# Patient Record
Sex: Female | Born: 1965
Health system: Southern US, Community
[De-identification: ages and names within clinical notes are randomized; demographics above are authoritative.]

## PROBLEM LIST (undated history)

## (undated) DIAGNOSIS — E119 Type 2 diabetes mellitus without complications: Secondary | ICD-10-CM

## (undated) DIAGNOSIS — I10 Essential (primary) hypertension: Secondary | ICD-10-CM

## (undated) DIAGNOSIS — F329 Major depressive disorder, single episode, unspecified: Secondary | ICD-10-CM

## (undated) DIAGNOSIS — F32A Depression, unspecified: Secondary | ICD-10-CM

## (undated) HISTORY — PX: APPENDECTOMY: SHX54

---

## 1997-10-28 ENCOUNTER — Inpatient Hospital Stay (HOSPITAL_COMMUNITY): Admission: AD | Admit: 1997-10-28 | Discharge: 1997-10-28 | Payer: Self-pay | Admitting: Obstetrics and Gynecology

## 1997-12-08 ENCOUNTER — Other Ambulatory Visit: Admission: RE | Admit: 1997-12-08 | Discharge: 1997-12-08 | Payer: Self-pay | Admitting: Obstetrics & Gynecology

## 1997-12-16 ENCOUNTER — Ambulatory Visit (HOSPITAL_COMMUNITY): Admission: RE | Admit: 1997-12-16 | Discharge: 1997-12-16 | Payer: Self-pay | Admitting: Obstetrics & Gynecology

## 1998-01-25 ENCOUNTER — Inpatient Hospital Stay (HOSPITAL_COMMUNITY): Admission: AD | Admit: 1998-01-25 | Discharge: 1998-01-25 | Payer: Self-pay | Admitting: Obstetrics and Gynecology

## 1998-02-27 ENCOUNTER — Inpatient Hospital Stay (HOSPITAL_COMMUNITY): Admission: AD | Admit: 1998-02-27 | Discharge: 1998-03-01 | Payer: Self-pay | Admitting: Obstetrics & Gynecology

## 1998-05-18 ENCOUNTER — Emergency Department (HOSPITAL_COMMUNITY): Admission: EM | Admit: 1998-05-18 | Discharge: 1998-05-18 | Payer: Self-pay | Admitting: Internal Medicine

## 1998-05-18 ENCOUNTER — Encounter: Payer: Self-pay | Admitting: Internal Medicine

## 2001-01-10 ENCOUNTER — Other Ambulatory Visit: Admission: RE | Admit: 2001-01-10 | Discharge: 2001-01-10 | Payer: Self-pay | Admitting: Obstetrics and Gynecology

## 2001-03-15 ENCOUNTER — Encounter: Payer: Self-pay | Admitting: Obstetrics and Gynecology

## 2001-03-15 ENCOUNTER — Ambulatory Visit (HOSPITAL_COMMUNITY): Admission: RE | Admit: 2001-03-15 | Discharge: 2001-03-15 | Payer: Self-pay | Admitting: Obstetrics and Gynecology

## 2001-03-21 ENCOUNTER — Inpatient Hospital Stay (HOSPITAL_COMMUNITY): Admission: AD | Admit: 2001-03-21 | Discharge: 2001-03-21 | Payer: Self-pay | Admitting: Obstetrics and Gynecology

## 2001-04-04 ENCOUNTER — Encounter: Admission: RE | Admit: 2001-04-04 | Discharge: 2001-07-03 | Payer: Self-pay | Admitting: Obstetrics and Gynecology

## 2001-05-01 ENCOUNTER — Inpatient Hospital Stay (HOSPITAL_COMMUNITY): Admission: AD | Admit: 2001-05-01 | Discharge: 2001-05-01 | Payer: Self-pay | Admitting: Obstetrics and Gynecology

## 2001-07-09 ENCOUNTER — Observation Stay (HOSPITAL_COMMUNITY): Admission: AD | Admit: 2001-07-09 | Discharge: 2001-07-10 | Payer: Self-pay | Admitting: Obstetrics and Gynecology

## 2001-07-12 ENCOUNTER — Inpatient Hospital Stay (HOSPITAL_COMMUNITY): Admission: AD | Admit: 2001-07-12 | Discharge: 2001-07-12 | Payer: Self-pay | Admitting: Obstetrics and Gynecology

## 2001-08-04 ENCOUNTER — Inpatient Hospital Stay (HOSPITAL_COMMUNITY): Admission: AD | Admit: 2001-08-04 | Discharge: 2001-08-06 | Payer: Self-pay | Admitting: Obstetrics and Gynecology

## 2002-08-01 ENCOUNTER — Other Ambulatory Visit: Admission: RE | Admit: 2002-08-01 | Discharge: 2002-08-01 | Payer: Self-pay | Admitting: Obstetrics and Gynecology

## 2003-08-13 ENCOUNTER — Other Ambulatory Visit: Admission: RE | Admit: 2003-08-13 | Discharge: 2003-08-13 | Payer: Self-pay | Admitting: Obstetrics and Gynecology

## 2005-03-04 ENCOUNTER — Other Ambulatory Visit: Admission: RE | Admit: 2005-03-04 | Discharge: 2005-03-04 | Payer: Self-pay | Admitting: Obstetrics and Gynecology

## 2005-04-26 ENCOUNTER — Ambulatory Visit (HOSPITAL_COMMUNITY): Admission: RE | Admit: 2005-04-26 | Discharge: 2005-04-26 | Payer: Self-pay | Admitting: Obstetrics and Gynecology

## 2005-06-22 ENCOUNTER — Encounter: Admission: RE | Admit: 2005-06-22 | Discharge: 2005-06-22 | Payer: Self-pay | Admitting: Obstetrics and Gynecology

## 2005-09-20 ENCOUNTER — Inpatient Hospital Stay (HOSPITAL_COMMUNITY): Admission: AD | Admit: 2005-09-20 | Discharge: 2005-09-23 | Payer: Self-pay | Admitting: Obstetrics and Gynecology

## 2007-08-04 IMAGING — US US OB DETAIL+14 WK
1 series · 13 of 28 positions shown · non-contrast
Comparison: none

CLINICAL DATA: 18 week 0 day assigned gestational age by prior office ultrasound.  Advanced maternal age.  Evaluate detailed anatomy.

[Series 1: us ob detail+14 wk · 0.27mm/px · 13 of 78 slices shown]
[im 3/78]
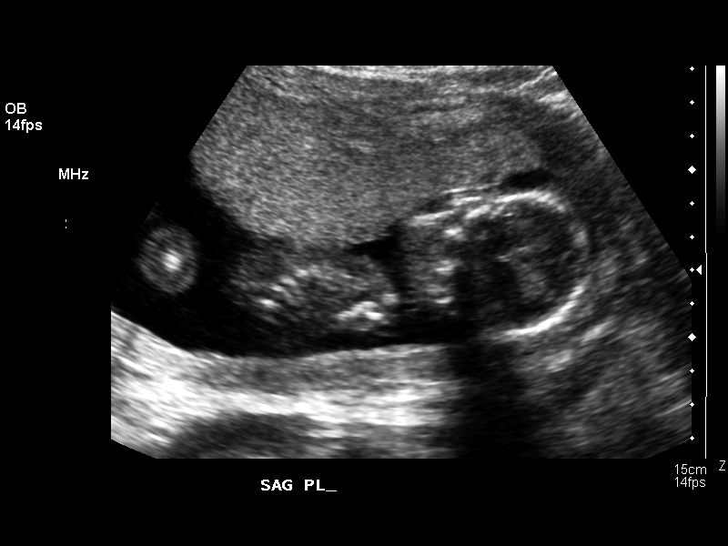
[im 9/78]
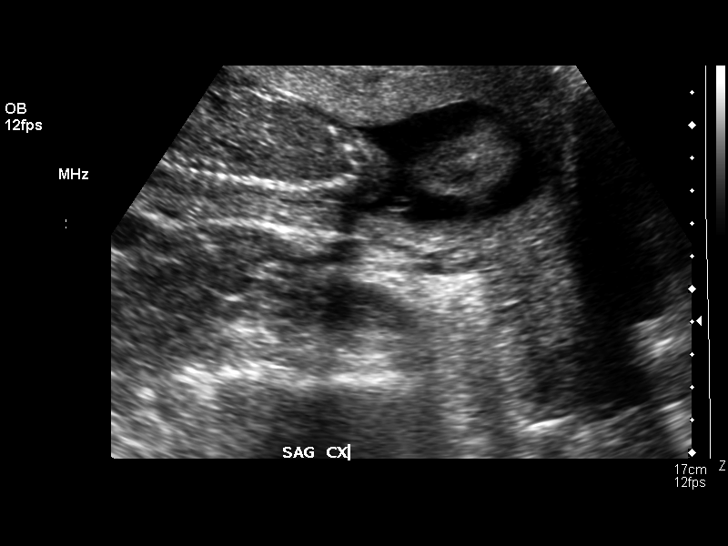
[im 15/78]
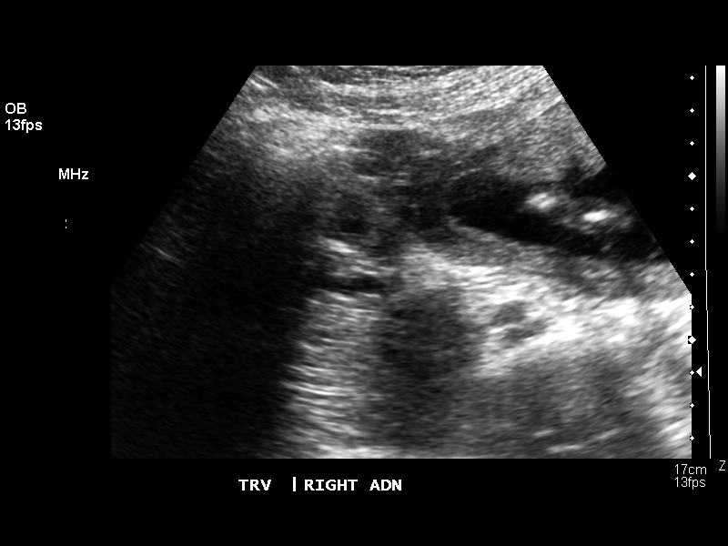
[im 20/78]
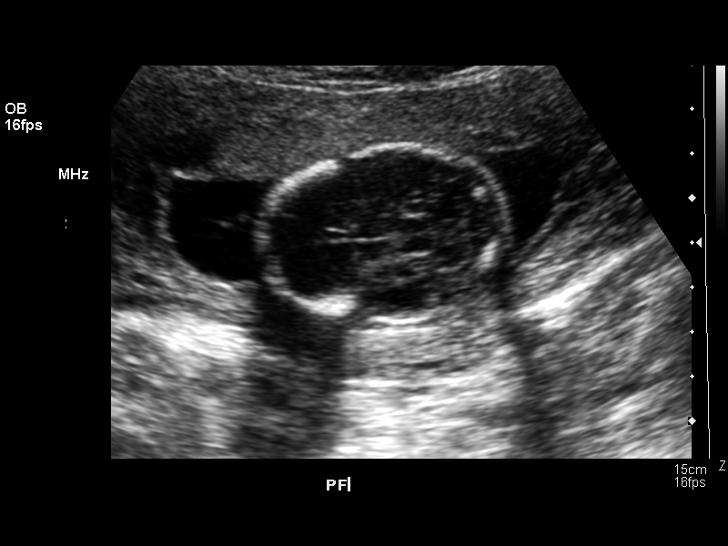
[im 26/78]
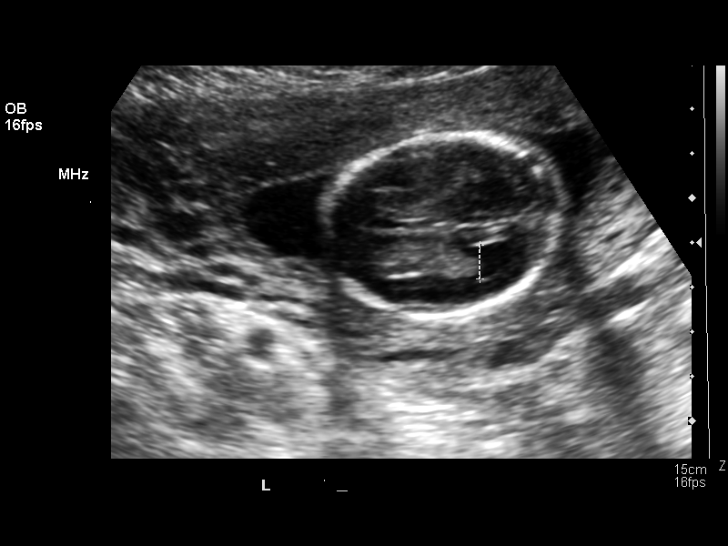
[im 32/78]
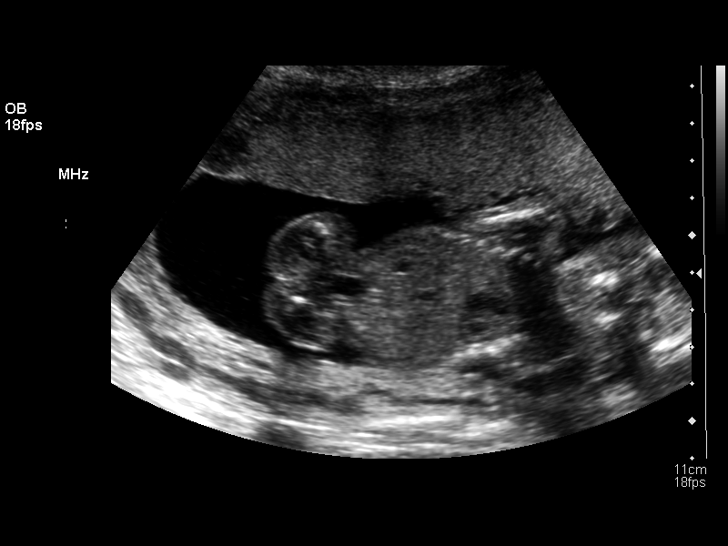
[im 40/78]
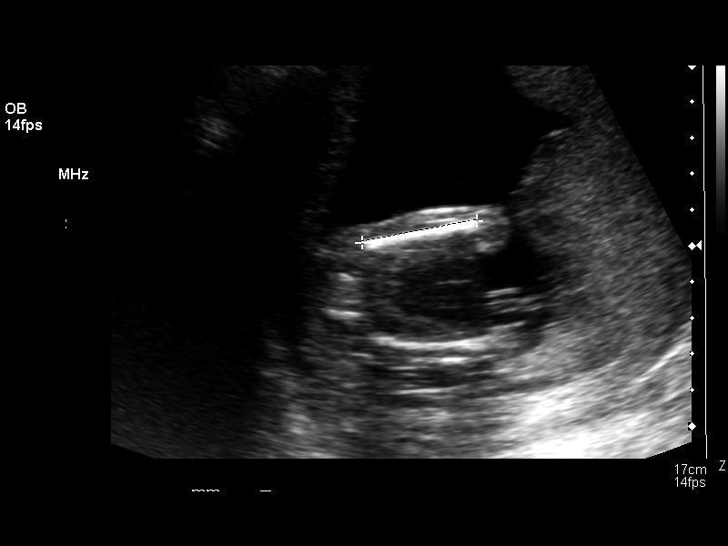
[im 46/78]
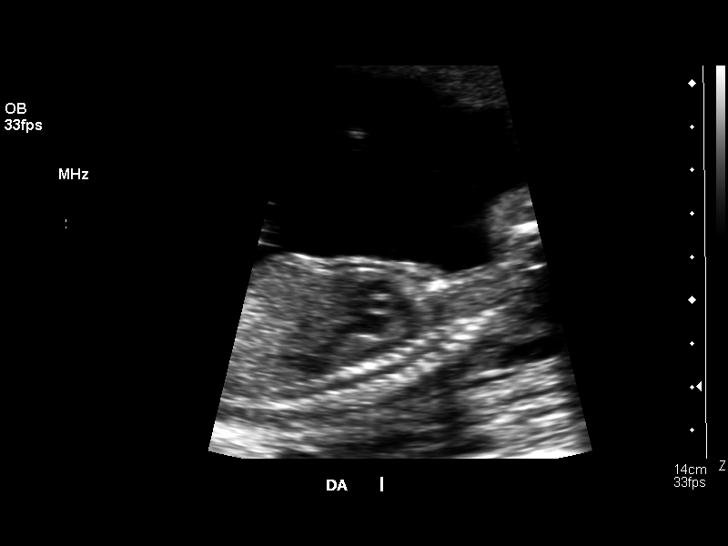
[im 52/78]
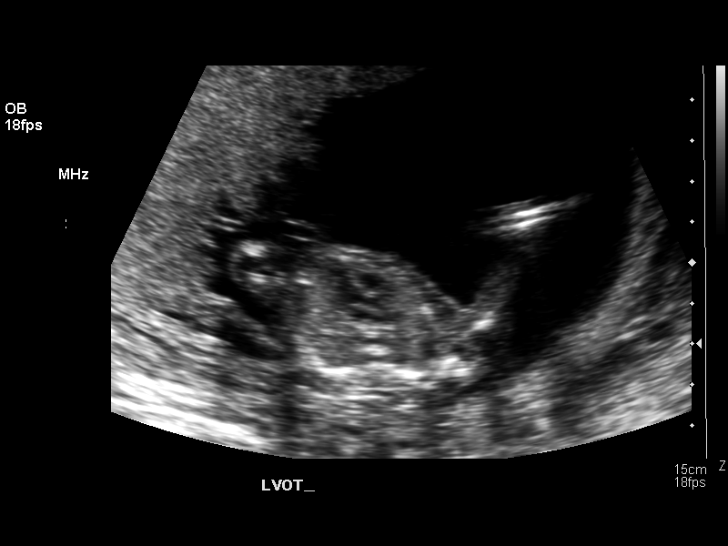
[im 58/78]
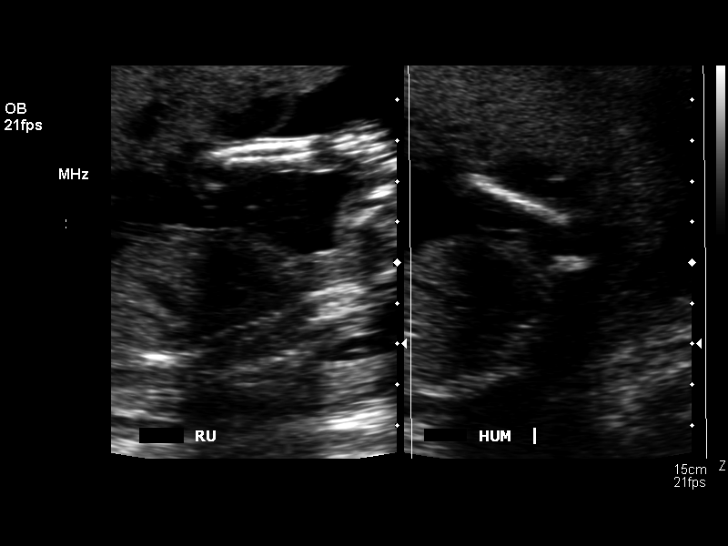
[im 63/78]
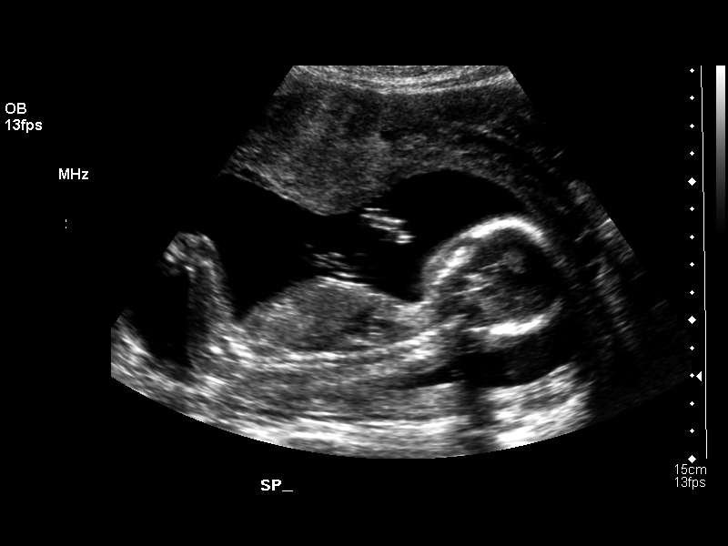
[im 69/78]
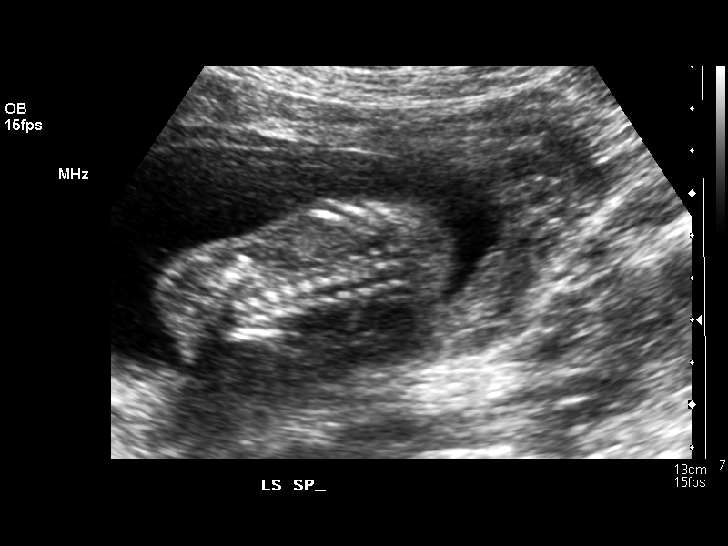
[im 75/78]
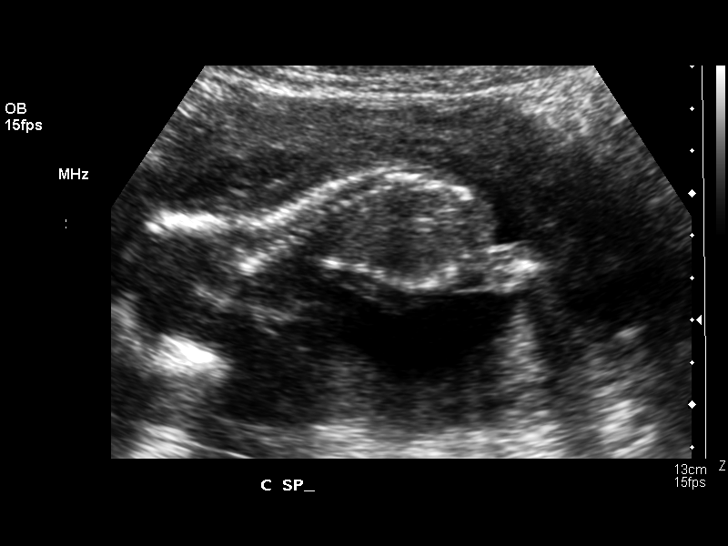

[13 of 28 positions shown; findings below may reference images not displayed]

DETAILED OBSTETRICAL ULTRASOUND:

 Number of Fetuses:  1
 Heart Rate:  147
 Movement:  Yes
 Breathing: No
 Presentation:  Cephalic
 Placental Location:  Anterior  
 Grade:  0
 Previa:  No
 Amniotic Fluid (Subjective):  Normal
 Amniotic Fluid (Objective):  5.2 cm Vertical pocket 

 FETAL BIOMETRY
 BPD:  4.2 cm   18 w 4 d
 HC:  15.7 cm   18 w 4 d
 AC:  13.2 cm   18 w 5 d 
 FL:    3.2 cm   19 w 6 d
 HL:   2.8 cm   19 w 0 d

 MEAN GA:  19 w 0 d

 FETAL ANATOMY
 Lateral Ventricles:  Visualized 
 Thalami/CSP:  Visualized 
 Posterior Fossa:  Visualized 
 Nuchal Region:  Visualized 
 Spine:  Visualized 
 4 Chamber Heart on Left:  Visualized 
 Stomach on Left:  Visualized 
 3 Vessel Cord:  Visualized 
 Cord Insertion Site:  Visualized 
 Kidneys:  Visualized 
 Bladder:  Visualized 
 Extremities:  Visualized 

 ADDITIONAL ANATOMY VISUALIZED:  LVOT, RVOT, upper lip, orbits, profile, diaphragm, heel, 5th digit, ductal arch, aortic arch, and male genitalia.  
 Comments:  A nasal bone is visualized.  No sonographic markers for Down syndrome are identified.  This reduces age based odds for Down syndrome from [DATE] to [DATE].

 MATERNAL UTERINE AND ADNEXAL FINDINGS
 Cervix:  3.6 cm Transabdominally
IMPRESSION: 1.  Assigned gestational age by prior office ultrasound is currently 18 weeks 0 days.  Appropriate fetal growth.
 2.  No evidence of fetal anatomic abnormality.  No sonographic markers for Down syndrome identified.  See above comments.

## 2010-06-14 ENCOUNTER — Encounter: Payer: Self-pay | Admitting: Family Medicine

## 2015-11-10 DIAGNOSIS — Z0000 Encounter for general adult medical examination without abnormal findings: Secondary | ICD-10-CM | POA: Diagnosis not present

## 2015-11-10 DIAGNOSIS — Z1389 Encounter for screening for other disorder: Secondary | ICD-10-CM | POA: Diagnosis not present

## 2016-03-28 DIAGNOSIS — R079 Chest pain, unspecified: Secondary | ICD-10-CM | POA: Diagnosis not present

## 2016-03-28 DIAGNOSIS — F411 Generalized anxiety disorder: Secondary | ICD-10-CM | POA: Diagnosis not present

## 2016-05-27 DIAGNOSIS — S63257A Unspecified dislocation of left little finger, initial encounter: Secondary | ICD-10-CM | POA: Diagnosis not present

## 2016-05-27 DIAGNOSIS — S63287A Dislocation of proximal interphalangeal joint of left little finger, initial encounter: Secondary | ICD-10-CM | POA: Diagnosis not present

## 2016-05-27 DIAGNOSIS — M25522 Pain in left elbow: Secondary | ICD-10-CM | POA: Diagnosis not present

## 2016-05-27 DIAGNOSIS — S63287D Dislocation of proximal interphalangeal joint of left little finger, subsequent encounter: Secondary | ICD-10-CM | POA: Diagnosis not present

## 2016-06-28 DIAGNOSIS — L237 Allergic contact dermatitis due to plants, except food: Secondary | ICD-10-CM | POA: Diagnosis not present

## 2016-06-28 DIAGNOSIS — L648 Other androgenic alopecia: Secondary | ICD-10-CM | POA: Diagnosis not present

## 2016-08-29 DIAGNOSIS — T300 Burn of unspecified body region, unspecified degree: Secondary | ICD-10-CM | POA: Diagnosis not present

## 2016-08-29 DIAGNOSIS — E1369 Other specified diabetes mellitus with other specified complication: Secondary | ICD-10-CM | POA: Diagnosis not present

## 2017-02-28 ENCOUNTER — Emergency Department (HOSPITAL_BASED_OUTPATIENT_CLINIC_OR_DEPARTMENT_OTHER): Payer: BLUE CROSS/BLUE SHIELD

## 2017-02-28 ENCOUNTER — Encounter (HOSPITAL_BASED_OUTPATIENT_CLINIC_OR_DEPARTMENT_OTHER): Payer: Self-pay | Admitting: Emergency Medicine

## 2017-02-28 ENCOUNTER — Emergency Department (HOSPITAL_BASED_OUTPATIENT_CLINIC_OR_DEPARTMENT_OTHER)
Admission: EM | Admit: 2017-02-28 | Discharge: 2017-02-28 | Disposition: A | Discharge status: Home / Self Care | Payer: BLUE CROSS/BLUE SHIELD | Attending: Emergency Medicine | Admitting: Emergency Medicine

## 2017-02-28 DIAGNOSIS — N939 Abnormal uterine and vaginal bleeding, unspecified: Secondary | ICD-10-CM

## 2017-02-28 DIAGNOSIS — Z7984 Long term (current) use of oral hypoglycemic drugs: Secondary | ICD-10-CM | POA: Insufficient documentation

## 2017-02-28 DIAGNOSIS — E119 Type 2 diabetes mellitus without complications: Secondary | ICD-10-CM | POA: Insufficient documentation

## 2017-02-28 DIAGNOSIS — I10 Essential (primary) hypertension: Secondary | ICD-10-CM | POA: Diagnosis not present

## 2017-02-28 DIAGNOSIS — M25511 Pain in right shoulder: Secondary | ICD-10-CM | POA: Diagnosis not present

## 2017-02-28 DIAGNOSIS — M778 Other enthesopathies, not elsewhere classified: Secondary | ICD-10-CM

## 2017-02-28 DIAGNOSIS — D649 Anemia, unspecified: Secondary | ICD-10-CM | POA: Insufficient documentation

## 2017-02-28 DIAGNOSIS — M7581 Other shoulder lesions, right shoulder: Secondary | ICD-10-CM | POA: Insufficient documentation

## 2017-02-28 HISTORY — DX: Major depressive disorder, single episode, unspecified: F32.9

## 2017-02-28 HISTORY — DX: Essential (primary) hypertension: I10

## 2017-02-28 HISTORY — DX: Type 2 diabetes mellitus without complications: E11.9

## 2017-02-28 HISTORY — DX: Depression, unspecified: F32.A

## 2017-02-28 LAB — CBC WITH DIFFERENTIAL/PLATELET
BASOS ABS: 0 10*3/uL (ref 0.0–0.1)
BASOS PCT: 0 %
EOS PCT: 1 %
Eosinophils Absolute: 0.1 10*3/uL (ref 0.0–0.7)
HEMATOCRIT: 33 % — AB (ref 36.0–46.0)
Hemoglobin: 11 g/dL — ABNORMAL LOW (ref 12.0–15.0)
Lymphocytes Relative: 24 %
Lymphs Abs: 2.2 10*3/uL (ref 0.7–4.0)
MCH: 25.6 pg — ABNORMAL LOW (ref 26.0–34.0)
MCHC: 33.3 g/dL (ref 30.0–36.0)
MCV: 76.9 fL — AB (ref 78.0–100.0)
MONO ABS: 0.7 10*3/uL (ref 0.1–1.0)
MONOS PCT: 8 %
NEUTROS ABS: 6.2 10*3/uL (ref 1.7–7.7)
Neutrophils Relative %: 67 %
PLATELETS: 244 10*3/uL (ref 150–400)
RBC: 4.29 MIL/uL (ref 3.87–5.11)
RDW: 14.9 % (ref 11.5–15.5)
WBC: 9.3 10*3/uL (ref 4.0–10.5)

## 2017-02-28 LAB — PREGNANCY, URINE: PREG TEST UR: NEGATIVE

## 2017-02-28 MED ORDER — NAPROXEN 500 MG PO TABS: 500.0000 mg | ORAL_TABLET | Freq: Two times a day (BID) | ORAL | 0 refills | Status: DC

## 2017-02-28 MED ORDER — CYCLOBENZAPRINE HCL 10 MG PO TABS: 10.0000 mg | ORAL_TABLET | Freq: Two times a day (BID) | ORAL | 0 refills | Status: AC | PRN

## 2017-02-28 MED FILL — NAPROXEN 500 MG TABLET: 500 | 15 days supply | Qty: 30 | Fill #0

## 2017-02-28 MED FILL — CYCLOBENZAPRINE 10 MG TAB: 10 | 10 days supply | Qty: 20 | Fill #0

## 2017-02-28 NOTE — Discharge Instructions (Signed)
Naprosyn for pain and inflammation. Flexeril for spasms. Continue ice/heat. Start range of motion exercises. Follow up with D. Hudnall for recheck. Follow up with your OB/GYN for bleeding.

## 2017-02-28 NOTE — ED Triage Notes (Signed)
Patient states that she woke up with a right shoulder pain when she raised her shoulder above her head. Patient also reports that she has had Vaginal Bleeding x 2 weeks and she thought that she was going through menopause. Patient reports that her LMP prior to this had been months ago. Patient states that she is having intermittent dizziness, also tried to call her GYN and [place]. Has a hx of anemia and she became concerned

## 2017-02-28 NOTE — ED Provider Notes (Signed)
[facility] Note   CSN: [id number] Arrival date & time: 02/28/17  7829     History   Chief Complaint Chief Complaint  Patient presents with  . Vaginal Discharge  . Shoulder Pain    HPI Rebekah Greene is a 51 y.o. female.  HPI Rebekah Greene is a 51 y.o. female with hx of DM, HTN, depression, presents to ED with complaint of vaginal bleeding and dizziness and shoulder pain. Pt states she has had vaginal bleeding for about 2 weeks. States she has not had a menstrual cycle prior to this for about a year. Reports associated dizziness, hx of anemia.  Pt also states she woke up 3 days ago with pain in right shoulder. Pain with movement. Denies any injuries. Pain does not radiate. No numbness or weakness. States unable to move it at all due to pain. Pt has been moving the day prior to onset of pain and has been lifting heavy boxes. No hx of the same. No fever or chills. Has been taking ibuprofen with mild relief. States pain is worse at night, unable to lay down on right side.     Past Medical History:  Diagnosis Date  . Depression   . Diabetes mellitus without complication (HCC)   . Hypertension     There are no active problems to display for this patient.   Past Surgical History:  Procedure Laterality Date  . APPENDECTOMY      OB History    No data available       Home Medications    Prior to Admission medications   Medication Sig Start Date End Date Taking? Authorizing Provider  buPROPion (WELLBUTRIN) 100 MG tablet Take 100 mg by mouth 2 (two) times daily.   Yes [provider]  metFORMIN (GLUCOPHAGE) 1000 MG tablet Take 1,000 mg by mouth 2 (two) times daily with a meal.   Yes [provider]  metoprolol tartrate (LOPRESSOR) 50 MG tablet Take 50 mg by mouth 2 (two) times daily.   Yes [provider]    Family History History reviewed. No pertinent family history.  Social History Social History  Substance Use  Topics  . Smoking status: Never Smoker  . Smokeless tobacco: Never Used  . Alcohol use No     Allergies   Patient has no known allergies.   Review of Systems Review of Systems  Constitutional: Negative for chills and fever.  Respiratory: Negative for cough, chest tightness and shortness of breath.   Cardiovascular: Negative for chest pain, palpitations and leg swelling.  Gastrointestinal: Negative for abdominal pain, diarrhea, nausea and vomiting.  Genitourinary: Positive for vaginal bleeding. Negative for dysuria, flank pain, pelvic pain, vaginal discharge and vaginal pain.  Musculoskeletal: Positive for arthralgias and myalgias. Negative for back pain, neck pain and neck stiffness.  Skin: Negative for rash.  Neurological: Positive for dizziness. Negative for weakness and headaches.  All other systems reviewed and are negative.    Physical Exam Updated Vital Signs BP 121/78 (BP Location: Left Arm)   Pulse 68   Temp 98.5 F (36.9 C) (Oral)   Resp 16   Ht  (1.651 m)   Wt 86.2 kg (190 lb)   SpO2 99%   BMI 31.62 kg/m   Physical Exam  Constitutional: She is oriented to person, place, and time. She appears well-developed and well-nourished. No distress.  HENT:  Head: Normocephalic.  Eyes: Conjunctivae are normal.  Neck: Neck supple.  Cardiovascular: Normal rate, regular rhythm  and normal heart sounds.   Pulmonary/Chest: Effort normal and breath sounds normal. No respiratory distress. She has no wheezes. She has no rales.  Abdominal: Soft. Bowel sounds are normal. She exhibits no distension. There is no tenderness. There is no rebound.  Musculoskeletal: She exhibits no edema.  Right shoulder normal appearing with no erythema, swelling, deformity. Pain with any ROM of the shoulder passively or actively. ROM with shoulder flexion to only about 30 degrees. Normal elbow and wrist. Distal radial pulses intact. Grip strength, bicep, tricep strength normal.   Neurological:  She is alert and oriented to person, place, and time.  Skin: Skin is warm and dry.  Psychiatric: She has a normal mood and affect. Her behavior is normal.  Nursing note and vitals reviewed.    ED Treatments / Results  Labs (all labs ordered are listed, but only abnormal results are displayed) Labs Reviewed  CBC WITH DIFFERENTIAL/PLATELET - Abnormal; Notable for the following:       Result Value   Hemoglobin 11.0 (*)    HCT 33.0 (*)    MCV 76.9 (*)    MCH 25.6 (*)    All other components within normal limits  PREGNANCY, URINE    EKG  EKG Interpretation None       Radiology Dg Shoulder Right  Result Date: 02/28/2017 CLINICAL DATA:  Right shoulder pain for several days. No known injury. History of diabetes. EXAM: RIGHT SHOULDER - 2+ VIEW COMPARISON:  None in PACs FINDINGS: The bones are subjectively adequately mineralized. The joint spaces are well maintained. A small subacromial spur is present. The soft tissues are unremarkable. There is no acute fracture or dislocation. IMPRESSION: Small subacromial spur. No acute or significant chronic bony abnormality elsewhere. Electronically Signed   By: David  [place] M.D.   On: 02/28/2017 09:43    Procedures Procedures (including critical care time)  Medications Ordered in ED Medications - No data to display   Initial Impression / Assessment and Plan / ED Course  I have reviewed the triage vital signs and the nursing notes.  Pertinent labs & imaging results that were available during my care of the patient were reviewed by me and considered in my medical decision making (see chart for details).     Patient with multiple complaints. She is mainly complaining of the pain in her right shoulder that began about 3 days ago. Patient is moving all lifting heavy boxes. No signs of an infection based on exam. X-ray obtained. Xray showing a spurr, otherwise unremarkable. Question impingement vs tendonitis. Doubtl tear unless chronic, since  no injuries. Advised to continue to take NSAIDs, will prescribe a muscle relaxant. Discussed exercises to increase range of motion and follow-up with orthopedics. Pt's pregnancy test is negative. hgb is 11, will have her follow up with her OB/GYN. Pt refused pelvic exam.   . Vitals:   02/28/17 0845  BP: 121/78  Pulse: 68  Resp: 16  Temp: 98.5 F (36.9 C)  TempSrc: Oral  SpO2: 99%  Weight: 86.2 kg (190 lb)  Height:  (1.651 m)        Final Clinical Impressions(s) / ED Diagnoses   Final diagnoses:  Right shoulder tendonitis  Anemia, unspecified type  Vaginal bleeding    New Prescriptions New Prescriptions   CYCLOBENZAPRINE (FLEXERIL) 10 MG TABLET    Take 1 tablet (10 mg total) by mouth 2 (two) times daily as needed for muscle spasms.   NAPROXEN (NAPROSYN) 500 MG TABLET  Take 1 tablet (500 mg total) by mouth 2 (two) times daily.     Jaynie Crumble, PA-C 02/28/17 1105    Vanetta Mulders, MD 02/28/17 1219

## 2017-03-07 ENCOUNTER — Encounter: Payer: Self-pay | Admitting: Family Medicine

## 2017-03-07 ENCOUNTER — Ambulatory Visit (INDEPENDENT_AMBULATORY_CARE_PROVIDER_SITE_OTHER): Payer: BLUE CROSS/BLUE SHIELD | Admitting: Family Medicine

## 2017-03-07 DIAGNOSIS — M25511 Pain in right shoulder: Secondary | ICD-10-CM

## 2017-03-07 MED ORDER — NAPROXEN 500 MG PO TABS: 500.0000 mg | ORAL_TABLET | Freq: Two times a day (BID) | ORAL | 1 refills | Status: AC

## 2017-03-07 MED ORDER — METHYLPREDNISOLONE ACETATE 40 MG/ML IJ SUSP
40.0000 mg | Freq: Once | INTRAMUSCULAR | Status: AC
  Administered 2017-03-07: 40 mg via INTRA_ARTICULAR

## 2017-03-07 NOTE — Patient Instructions (Signed)
You have rotator cuff strain and impingement Try to avoid painful activities (overhead activities, lifting with extended arm) as much as possible. Naproxen twice a day with food for pain and inflammation. Can take tylenol in addition to this. Subacromial injection may be beneficial to help with pain and to decrease inflammation - you were given this today. Consider physical therapy with transition to home exercise program. Do home exercise program with theraband and scapular stabilization exercises daily 3 sets of 10 once a day. If not improving at follow-up we will consider further imaging, physical therapy, and/or nitro patches. Follow up with me in 6 weeks.

## 2017-03-09 DIAGNOSIS — M25511 Pain in right shoulder: Secondary | ICD-10-CM | POA: Insufficient documentation

## 2017-03-09 NOTE — Assessment & Plan Note (Signed)
2/2 rotator cuff strain and impingement.  Naproxen twice a day.  Tylenol if needed.  Shown home exercises to do daily.  Subacromial injection given as well.  F/u in 6 weeks. Consider imaging, PT, nitro patches if not improving.  F/u in 6 weeks.  After informed written consent timeout was performed, patient was seated on exam table. Right shoulder was prepped with alcohol swab and utilizing posterior approach, patient's right subacromial space was injected with 3:1 bupivicaine: depomedrol. Patient tolerated the procedure well without immediate complications.

## 2017-03-09 NOTE — Progress Notes (Signed)
PCP: Patient, No Pcp Per  Subjective:   HPI: Patient is a 51 y.o. female here for right shoulder pain.  Patient reports she was in the process of moving back in August and the next day felt soreness in lateral right shoulder but without acute injury. Then went to storage unit about 2 1/2 weeks ago with similar history - moved some things but no acute injury. By next day pain in right shoulder was bad and couldn't move the arm. Has eased some since then. Pain up to 6/10 at worst, sharp. + night pain. Taking naproxen and muscle relaxant. No prior injuries. No skin changes, numbness. Right handed.  Past Medical History:  Diagnosis Date  . Depression   . Diabetes mellitus without complication (HCC)   . Hypertension     Current Outpatient Prescriptions on File Prior to Visit  Medication Sig Dispense Refill  . cyclobenzaprine (FLEXERIL) 10 MG tablet Take 1 tablet (10 mg total) by mouth 2 (two) times daily as needed for muscle spasms. 20 tablet 0  . metFORMIN (GLUCOPHAGE) 1000 MG tablet Take 1,000 mg by mouth 2 (two) times daily with a meal.     No current facility-administered medications on file prior to visit.     Past Surgical History:  Procedure Laterality Date  . APPENDECTOMY      No Known Allergies  Social History   Social History  . Marital status: Single    Spouse name: N/A  . Number of children: N/A  . Years of education: N/A   Occupational History  . Not on file.   Social History Main Topics  . Smoking status: Never Smoker  . Smokeless tobacco: Never Used  . Alcohol use No  . Drug use: No  . Sexual activity: Not on file   Other Topics Concern  . Not on file   Social History Narrative  . No narrative on file    No family history on file.  BP (!) 138/93   Pulse 77   Ht 5\' 5"  (1.651 m)   Review of Systems: See HPI above.     Objective:  Physical Exam:  Gen: NAD, comfortable in exam room  Right shoulder: No swelling, ecchymoses.  No  gross deformity. No TTP. FROM with painful arc. Positive Hawkins, Neers. Negative Yergasons. Strength 5/5 with empty can and resisted internal/external rotation.  Pain empty can > ER. Negative apprehension. NV intact distally.  Left shoulder: No swelling, ecchymoses.  No gross deformity. No TTP. FROM. Strength 5/5 with empty can and resisted internal/external rotation. NV intact distally.   Assessment & Plan:  1. Right shoulder pain - 2/2 rotator cuff strain and impingement.  Naproxen twice a day.  Tylenol if needed.  Shown home exercises to do daily.  Subacromial injection given as well.  F/u in 6 weeks. Consider imaging, PT, nitro patches if not improving.  F/u in 6 weeks.  After informed written consent timeout was performed, patient was seated on exam table. Right shoulder was prepped with alcohol swab and utilizing posterior approach, patient's right subacromial space was injected with 3:1 bupivicaine: depomedrol. Patient tolerated the procedure well without immediate complications.

## 2017-03-27 DIAGNOSIS — L659 Nonscarring hair loss, unspecified: Secondary | ICD-10-CM | POA: Diagnosis not present

## 2017-03-27 DIAGNOSIS — L668 Other cicatricial alopecia: Secondary | ICD-10-CM | POA: Diagnosis not present

## 2017-03-27 DIAGNOSIS — L658 Other specified nonscarring hair loss: Secondary | ICD-10-CM | POA: Diagnosis not present

## 2017-03-27 DIAGNOSIS — L669 Cicatricial alopecia, unspecified: Secondary | ICD-10-CM | POA: Diagnosis not present

## 2017-03-27 DIAGNOSIS — L650 Telogen effluvium: Secondary | ICD-10-CM | POA: Diagnosis not present

## 2017-04-10 DIAGNOSIS — L089 Local infection of the skin and subcutaneous tissue, unspecified: Secondary | ICD-10-CM | POA: Diagnosis not present

## 2017-04-10 DIAGNOSIS — L650 Telogen effluvium: Secondary | ICD-10-CM | POA: Diagnosis not present

## 2017-04-10 DIAGNOSIS — L669 Cicatricial alopecia, unspecified: Secondary | ICD-10-CM | POA: Diagnosis not present

## 2017-04-18 ENCOUNTER — Ambulatory Visit: Payer: BLUE CROSS/BLUE SHIELD | Admitting: Family Medicine

## 2017-07-03 DIAGNOSIS — L669 Cicatricial alopecia, unspecified: Secondary | ICD-10-CM | POA: Diagnosis not present

## 2017-07-03 DIAGNOSIS — L089 Local infection of the skin and subcutaneous tissue, unspecified: Secondary | ICD-10-CM | POA: Diagnosis not present

## 2017-08-28 DIAGNOSIS — L089 Local infection of the skin and subcutaneous tissue, unspecified: Secondary | ICD-10-CM | POA: Diagnosis not present

## 2017-08-28 DIAGNOSIS — L669 Cicatricial alopecia, unspecified: Secondary | ICD-10-CM | POA: Diagnosis not present

## 2017-09-05 DIAGNOSIS — F419 Anxiety disorder, unspecified: Secondary | ICD-10-CM | POA: Diagnosis not present

## 2017-09-05 DIAGNOSIS — F4541 Pain disorder exclusively related to psychological factors: Secondary | ICD-10-CM | POA: Diagnosis not present

## 2017-09-05 DIAGNOSIS — K219 Gastro-esophageal reflux disease without esophagitis: Secondary | ICD-10-CM | POA: Diagnosis not present

## 2017-09-05 DIAGNOSIS — R51 Headache: Secondary | ICD-10-CM | POA: Diagnosis not present

## 2017-10-05 DIAGNOSIS — I10 Essential (primary) hypertension: Secondary | ICD-10-CM | POA: Diagnosis not present

## 2017-10-05 DIAGNOSIS — K219 Gastro-esophageal reflux disease without esophagitis: Secondary | ICD-10-CM | POA: Diagnosis not present

## 2017-10-05 DIAGNOSIS — E119 Type 2 diabetes mellitus without complications: Secondary | ICD-10-CM | POA: Diagnosis not present

## 2017-10-05 DIAGNOSIS — G43909 Migraine, unspecified, not intractable, without status migrainosus: Secondary | ICD-10-CM | POA: Diagnosis not present

## 2017-10-12 DIAGNOSIS — F331 Major depressive disorder, recurrent, moderate: Secondary | ICD-10-CM | POA: Diagnosis not present

## 2017-10-12 DIAGNOSIS — F410 Panic disorder [episodic paroxysmal anxiety] without agoraphobia: Secondary | ICD-10-CM | POA: Diagnosis not present

## 2017-10-17 DIAGNOSIS — Z1231 Encounter for screening mammogram for malignant neoplasm of breast: Secondary | ICD-10-CM | POA: Diagnosis not present

## 2017-10-17 DIAGNOSIS — E119 Type 2 diabetes mellitus without complications: Secondary | ICD-10-CM | POA: Diagnosis not present

## 2017-10-17 DIAGNOSIS — I10 Essential (primary) hypertension: Secondary | ICD-10-CM | POA: Diagnosis not present

## 2017-10-17 DIAGNOSIS — E559 Vitamin D deficiency, unspecified: Secondary | ICD-10-CM | POA: Diagnosis not present

## 2017-11-29 DIAGNOSIS — F4322 Adjustment disorder with anxiety: Secondary | ICD-10-CM | POA: Diagnosis not present

## 2018-01-10 DIAGNOSIS — F4321 Adjustment disorder with depressed mood: Secondary | ICD-10-CM | POA: Diagnosis not present

## 2018-01-10 DIAGNOSIS — F410 Panic disorder [episodic paroxysmal anxiety] without agoraphobia: Secondary | ICD-10-CM | POA: Diagnosis not present

## 2018-02-16 DIAGNOSIS — F430 Acute stress reaction: Secondary | ICD-10-CM | POA: Diagnosis not present

## 2018-03-30 DIAGNOSIS — F331 Major depressive disorder, recurrent, moderate: Secondary | ICD-10-CM | POA: Diagnosis not present

## 2018-03-30 DIAGNOSIS — F410 Panic disorder [episodic paroxysmal anxiety] without agoraphobia: Secondary | ICD-10-CM | POA: Diagnosis not present

## 2018-09-07 DIAGNOSIS — F4321 Adjustment disorder with depressed mood: Secondary | ICD-10-CM | POA: Diagnosis not present

## 2018-09-07 DIAGNOSIS — F411 Generalized anxiety disorder: Secondary | ICD-10-CM | POA: Diagnosis not present

## 2018-09-07 DIAGNOSIS — F330 Major depressive disorder, recurrent, mild: Secondary | ICD-10-CM | POA: Diagnosis not present

## 2018-11-12 DIAGNOSIS — F331 Major depressive disorder, recurrent, moderate: Secondary | ICD-10-CM | POA: Diagnosis not present

## 2018-11-12 DIAGNOSIS — F410 Panic disorder [episodic paroxysmal anxiety] without agoraphobia: Secondary | ICD-10-CM | POA: Diagnosis not present

## 2018-12-20 DIAGNOSIS — B373 Candidiasis of vulva and vagina: Secondary | ICD-10-CM | POA: Diagnosis not present

## 2019-04-08 ENCOUNTER — Encounter (HOSPITAL_BASED_OUTPATIENT_CLINIC_OR_DEPARTMENT_OTHER): Payer: Self-pay | Admitting: *Deleted

## 2019-04-08 ENCOUNTER — Other Ambulatory Visit: Payer: Self-pay

## 2019-04-08 ENCOUNTER — Emergency Department (HOSPITAL_BASED_OUTPATIENT_CLINIC_OR_DEPARTMENT_OTHER)
Admission: EM | Admit: 2019-04-08 | Discharge: 2019-04-08 | Disposition: A | Discharge status: Home / Self Care | Payer: BC Managed Care – PPO | Attending: Emergency Medicine | Admitting: Emergency Medicine

## 2019-04-08 ENCOUNTER — Emergency Department (HOSPITAL_BASED_OUTPATIENT_CLINIC_OR_DEPARTMENT_OTHER): Payer: BC Managed Care – PPO

## 2019-04-08 DIAGNOSIS — E119 Type 2 diabetes mellitus without complications: Secondary | ICD-10-CM | POA: Diagnosis not present

## 2019-04-08 DIAGNOSIS — N3001 Acute cystitis with hematuria: Secondary | ICD-10-CM | POA: Diagnosis not present

## 2019-04-08 DIAGNOSIS — Z7984 Long term (current) use of oral hypoglycemic drugs: Secondary | ICD-10-CM | POA: Insufficient documentation

## 2019-04-08 DIAGNOSIS — Z20828 Contact with and (suspected) exposure to other viral communicable diseases: Secondary | ICD-10-CM

## 2019-04-08 DIAGNOSIS — R0602 Shortness of breath: Secondary | ICD-10-CM | POA: Diagnosis not present

## 2019-04-08 DIAGNOSIS — Z20822 Contact with and (suspected) exposure to covid-19: Secondary | ICD-10-CM

## 2019-04-08 DIAGNOSIS — U071 COVID-19: Secondary | ICD-10-CM | POA: Insufficient documentation

## 2019-04-08 DIAGNOSIS — R05 Cough: Secondary | ICD-10-CM | POA: Diagnosis not present

## 2019-04-08 DIAGNOSIS — Z79899 Other long term (current) drug therapy: Secondary | ICD-10-CM | POA: Insufficient documentation

## 2019-04-08 DIAGNOSIS — I10 Essential (primary) hypertension: Secondary | ICD-10-CM | POA: Insufficient documentation

## 2019-04-08 DIAGNOSIS — R509 Fever, unspecified: Secondary | ICD-10-CM | POA: Diagnosis not present

## 2019-04-08 LAB — BASIC METABOLIC PANEL
Anion gap: 9 (ref 5–15)
BUN: 6 mg/dL (ref 6–20)
CO2: 28 mmol/L (ref 22–32)
Calcium: 8.7 mg/dL — ABNORMAL LOW (ref 8.9–10.3)
Chloride: 100 mmol/L (ref 98–111)
Creatinine, Ser: 0.61 mg/dL (ref 0.44–1.00)
GFR calc Af Amer: >60 mL/min (ref >60)
GFR calc non Af Amer: >60 mL/min (ref >60)
Glucose, Bld: 208 mg/dL — ABNORMAL HIGH (ref 70–99)
Potassium: 3.7 mmol/L (ref 3.5–5.1)
Sodium: 137 mmol/L (ref 135–145)

## 2019-04-08 LAB — URINALYSIS, MICROSCOPIC (REFLEX)

## 2019-04-08 LAB — CBC WITH DIFFERENTIAL/PLATELET
Abs Immature Granulocytes: 0.01 10*3/uL (ref 0.00–0.07)
Basophils Absolute: 0 10*3/uL (ref 0.0–0.1)
Basophils Relative: 0 %
Eosinophils Absolute: 0 10*3/uL (ref 0.0–0.5)
Eosinophils Relative: 0 %
HCT: 42.2 % (ref 36.0–46.0)
Hemoglobin: 13.3 g/dL (ref 12.0–15.0)
Immature Granulocytes: 0 %
Lymphocytes Relative: 33 %
Lymphs Abs: 1.9 10*3/uL (ref 0.7–4.0)
MCH: 25.1 pg — ABNORMAL LOW (ref 26.0–34.0)
MCHC: 31.5 g/dL (ref 30.0–36.0)
MCV: 79.8 fL — ABNORMAL LOW (ref 80.0–100.0)
Monocytes Absolute: 0.4 10*3/uL (ref 0.1–1.0)
Monocytes Relative: 7 %
Neutro Abs: 3.5 10*3/uL (ref 1.7–7.7)
Neutrophils Relative %: 60 %
Platelets: 209 10*3/uL (ref 150–400)
RBC: 5.29 MIL/uL — ABNORMAL HIGH (ref 3.87–5.11)
RDW: 14.4 % (ref 11.5–15.5)
WBC: 5.9 10*3/uL (ref 4.0–10.5)
nRBC: 0 % (ref 0.0–0.2)

## 2019-04-08 LAB — URINALYSIS, ROUTINE W REFLEX MICROSCOPIC
Bilirubin Urine: NEGATIVE
Glucose, UA: >=500 mg/dL — AB
Ketones, ur: NEGATIVE mg/dL
Nitrite: POSITIVE — AB
Protein, ur: NEGATIVE mg/dL
Specific Gravity, Urine: 1.015 (ref 1.005–1.030)
pH: 6 (ref 5.0–8.0)

## 2019-04-08 LAB — SARS CORONAVIRUS 2 (TAT 6-24 HRS): SARS Coronavirus 2: POSITIVE — AB

## 2019-04-08 MED ORDER — ONDANSETRON HCL 4 MG/2ML IJ SOLN
4.0000 mg | Freq: Once | INTRAMUSCULAR | Status: AC
  Administered 2019-04-08: 15:00:00 4 mg via INTRAVENOUS
  Filled 2019-04-08: qty 2

## 2019-04-08 MED ORDER — DIPHENHYDRAMINE HCL 50 MG/ML IJ SOLN
25.0000 mg | Freq: Once | INTRAMUSCULAR | Status: AC
  Administered 2019-04-08: 17:00:00 25 mg via INTRAVENOUS
  Filled 2019-04-08: qty 1

## 2019-04-08 MED ORDER — SODIUM CHLORIDE 0.9 % IV BOLUS
1000.0000 mL | Freq: Once | INTRAVENOUS | Status: AC
  Administered 2019-04-08: 15:00:00 1000 mL via INTRAVENOUS

## 2019-04-08 MED ORDER — PROCHLORPERAZINE EDISYLATE 10 MG/2ML IJ SOLN
10.0000 mg | Freq: Once | INTRAMUSCULAR | Status: AC
  Administered 2019-04-08: 17:00:00 10 mg via INTRAVENOUS
  Filled 2019-04-08: qty 2

## 2019-04-08 MED ORDER — CEPHALEXIN 500 MG PO CAPS: 500.0000 mg | ORAL_CAPSULE | Freq: Two times a day (BID) | ORAL | 0 refills | Status: AC

## 2019-04-08 MED ORDER — ACETAMINOPHEN 325 MG PO TABS
650.0000 mg | ORAL_TABLET | Freq: Once | ORAL | Status: AC
  Administered 2019-04-08: 15:00:00 650 mg via ORAL
  Filled 2019-04-08: qty 2

## 2019-04-08 NOTE — ED Triage Notes (Signed)
Pt has chills, fever, body aches, headache, weakness, fatigue, cough, sob, loss of taste, loss of smell, sore throat, runny nose, abdominal pain and diarrhea.

## 2019-04-08 NOTE — Discharge Instructions (Addendum)
You have been seen today for covid like symptoms. Please read and follow all provided instructions. Return to the emergency room for worsening condition or new concerning symptoms.    Your urine sample today shows you have a urinary tract infection. You were tested for coronavirus today. The result should be available in 24-48 hours. Please continue to self isolate until you have the result.  1. Medications:  Prescription sent to your pharmacy for Keflex.  This is an antibiotic used to treat urinary tract infections.  Please take as prescribed. Continue usual home medications Take medications as prescribed. Please review all of the medicines and only take them if you do not have an allergy to them.   2. Treatment: rest, drink plenty of fluids  3. Follow Up: Please follow up with your primary doctor in 2-5 days for discussion of your diagnoses and further evaluation after today's visit; Call today to arrange your follow up.   It is also a possibility that you have an allergic reaction to any of the medicines that you have been prescribed - Everybody reacts differently to medications and while MOST people have no trouble with most medicines, you may have a reaction such as nausea, vomiting, rash, swelling, shortness of breath. If this is the case, please stop taking the medicine immediately and contact your physician.  ?

## 2019-04-08 NOTE — ED Provider Notes (Signed)
[facility] EMERGENCY DEPARTMENT Provider Note   CSN: [id number] Arrival date & time: 04/08/19  1302     History   Chief Complaint Multiple medical complaints, Covid-like symptoms  HPI Rebekah Greene is a 53 y.o. female with past medical history significant for depression, type 2 diabetes on Metformin, hypertension presents to emergency department today with multiple chief complaints.  She is reporting chills, fever, generalized body aches, headache, weakness, fatigue, cough, loss of smell, sore throat, runny nose, generalized abdominal pain, 2 episodes of nonbloody diarrhea, nausea x5 days.  She has not taken any medications for symptoms prior to arrival.  She states her T-max was on day of symptom onset at 99.4.  Patient denies any known Covid exposures but states she has been around her daughter's boyfriend who plays basketball and has had several teammates test positive. She describes her cough as nonproductive.  She states her throat is sore when swallowing.  Her headache feels like headaches she has had in the past.  She denies sudden onset.  She states headache has progressively worsened since onset.  She rates the pain as 4 out of 10 in severity.  She is also reporting urinary frequency without dysuria.  She denies chest pain, shortness of breath wheezing, rash, neck pain.  Past Medical History:  Diagnosis Date  . Depression   . Diabetes mellitus without complication ([place])   . Hypertension     Patient Active Problem List   Diagnosis Date Noted  . Right shoulder pain 03/09/2017    Past Surgical History:  Procedure Laterality Date  . APPENDECTOMY       OB History   No obstetric history on file.      Home Medications    Prior to Admission medications   Medication Sig Start Date End Date Taking? Authorizing Provider  ALPRAZolam Duanne Moron) 0.5 MG tablet  06/12/18  Yes [provider]  buPROPion (WELLBUTRIN XL) 150 MG 24 hr tablet  01/10/17  Yes  [provider]  metFORMIN (GLUCOPHAGE) 1000 MG tablet Take 1,000 mg by mouth 2 (two) times daily with a meal.   Yes [provider]  cephALEXin (KEFLEX) 500 MG capsule Take 1 capsule (500 mg total) by mouth 2 (two) times daily for 5 days. 04/08/19 04/13/19  Albrizze, Kaitlyn E, PA-C  cyclobenzaprine (FLEXERIL) 10 MG tablet Take 1 tablet (10 mg total) by mouth 2 (two) times daily as needed for muscle spasms. 02/28/17   Kirichenko, Lahoma Rocker, PA-C  metoprolol succinate (TOPROL-XL) 50 MG 24 hr tablet  01/10/17   [provider]  naproxen (NAPROSYN) 500 MG tablet Take 1 tablet (500 mg total) by mouth 2 (two) times daily. 03/07/17   Dene Gentry, MD    Family History No family history on file.  Social History Social History   Tobacco Use  . Smoking status: Never Smoker  . Smokeless tobacco: Never Used  Substance Use Topics  . Alcohol use: No  . Drug use: No     Allergies   Patient has no known allergies.   Review of Systems Review of Systems  Constitutional: Positive for chills and fever.  HENT: Positive for congestion and sore throat. Negative for ear discharge, ear pain, sinus pressure and sinus pain.   Eyes: Negative for pain and redness.  Respiratory: Positive for cough. Negative for shortness of breath and wheezing.   Cardiovascular: Negative for chest pain.  Gastrointestinal: Positive for abdominal pain and nausea. Negative for constipation, diarrhea and vomiting.  Genitourinary: Positive  for frequency. Negative for dysuria and hematuria.  Musculoskeletal: Positive for arthralgias. Negative for back pain and neck pain.  Skin: Negative for rash and wound.  Allergic/Immunologic: Positive for immunocompromised state (diabetic).  Neurological: Negative for dizziness, weakness, numbness and headaches.     Physical Exam Updated Vital Signs BP 137/87   Pulse 77   Temp 99.2 F (37.3 C) (Oral)   Resp 20   Ht  (1.651 m)   Wt 87.1 kg   SpO2  100%   BMI 31.95 kg/m   Physical Exam Vitals signs and nursing note reviewed.  Constitutional:      General: She is not in acute distress.    Appearance: She is not ill-appearing.  HENT:     Head: Normocephalic and atraumatic.     Comments: No sinus or temporal tenderness.    Right Ear: Tympanic membrane and external ear normal.     Left Ear: Tympanic membrane and external ear normal.     Nose: Congestion present.     Mouth/Throat:     Mouth: Mucous membranes are moist.     Pharynx: Oropharynx is clear.     Comments: No erythema to oropharynx, no edema, no exudate, no tonsillar swelling, voice normal, neck supple without lymphadenopathy  Eyes:     General: No scleral icterus.       Right eye: No discharge.        Left eye: No discharge.     Extraocular Movements: Extraocular movements intact.     Conjunctiva/sclera: Conjunctivae normal.     Pupils: Pupils are equal, round, and reactive to light.  Neck:     Musculoskeletal: Normal range of motion.     Vascular: No JVD.     Comments: No meningeal signs Cardiovascular:     Rate and Rhythm: Normal rate and regular rhythm.     Pulses: Normal pulses.          Radial pulses are 2+ on the right side and 2+ on the left side.     Heart sounds: Normal heart sounds.  Pulmonary:     Comments: Lungs clear to auscultation in all fields. Symmetric chest rise. No wheezing, rales, or rhonchi. Abdominal:     Tenderness: There is no right CVA tenderness or left CVA tenderness.     Comments: Abdomen is soft, non-distended, and non-tender in all quadrants. No rigidity, no guarding. No peritoneal signs.  Musculoskeletal: Normal range of motion.  Skin:    General: Skin is warm and dry.     Capillary Refill: Capillary refill takes less than 2 seconds.     Findings: No rash.  Neurological:     Mental Status: She is oriented to person, place, and time.     GCS: GCS eye subscore is 4. GCS verbal subscore is 5. GCS motor subscore is 6.      Comments: Speech is clear and goal oriented, follows commands CN III-XII intact, no facial droop Normal strength in upper and lower extremities bilaterally including dorsiflexion and plantar flexion, strong and equal grip strength Sensation normal to light and sharp touch Moves extremities without ataxia, coordination intact Normal finger to nose and rapid alternating movements Normal gait and balance   Psychiatric:        Behavior: Behavior normal.      ED Treatments / Results  Labs (all labs ordered are listed, but only abnormal results are displayed) Labs Reviewed  CBC WITH DIFFERENTIAL/PLATELET - Abnormal; Notable for the following components:  Result Value   RBC 5.29 (*)    MCV 79.8 (*)    MCH 25.1 (*)    All other components within normal limits  BASIC METABOLIC PANEL - Abnormal; Notable for the following components:   Glucose, Bld 208 (*)    Calcium 8.7 (*)    All other components within normal limits  URINALYSIS, ROUTINE W REFLEX MICROSCOPIC - Abnormal; Notable for the following components:   APPearance CLOUDY (*)    Glucose, UA >=500 (*)    Hgb urine dipstick SMALL (*)    Nitrite POSITIVE (*)    Leukocytes,Ua SMALL (*)    All other components within normal limits  URINALYSIS, MICROSCOPIC (REFLEX) - Abnormal; Notable for the following components:   Bacteria, UA MANY (*)    All other components within normal limits  SARS CORONAVIRUS 2 (TAT 6-24 HRS)  URINE CULTURE    EKG None  Radiology Dg Chest Portable 1 View  Result Date: 04/08/2019 CLINICAL DATA:  Cough with fever and chills.  Shortness of breath EXAM: PORTABLE CHEST 1 VIEW COMPARISON:  None. FINDINGS: There is no edema or consolidation. Heart size and pulmonary vascularity are normal. No adenopathy. No bone lesions. IMPRESSION: No edema or consolidation. Electronically Signed   By: Bretta BangWilliam  Woodruff III M.D.   On: 04/08/2019 15:25    Procedures Procedures (including critical care time)   Medications Ordered in ED Medications  sodium chloride 0.9 % bolus 1,000 mL (0 mLs Intravenous Stopped 04/08/19 1645)  acetaminophen (TYLENOL) tablet 650 mg (650 mg Oral Given 04/08/19 1523)  ondansetron (ZOFRAN) injection 4 mg (4 mg Intravenous Given 04/08/19 1524)  prochlorperazine (COMPAZINE) injection 10 mg (10 mg Intravenous Given 04/08/19 1644)  diphenhydrAMINE (BENADRYL) injection 25 mg (25 mg Intravenous Given 04/08/19 1643)     Initial Impression / Assessment and Plan / ED Course  I have reviewed the triage vital signs and the nursing notes.  Pertinent labs & imaging results that were available during my care of the patient were reviewed by me and considered in my medical decision making (see chart for details).  Patient seen and examined. Patient nontoxic appearing, in no apparent distress, not septic appearing. In triage her temperature is 99.2, no tachycardia, no hypoxia, normotensive.  On exam she has no meningeal signs.  Her lungs are clear to auscultation all fields, normal work of breathing, symmetric chest rise.  Abdomen is nontender, no rigidity or guarding, no peritoneal signs.  No CVA tenderness. Neuro exam is normal.  Labs today show no leukocytosis, No anemia, no severe electrolyte derangement, no renal insufficiency. UA is concerning for infection with positive nitrate, small leukocytes, 6-10 WBC.  Will send for culture. Send out Covid test performed.  Patient aware she will need to self quarantine and self isolate till she has the result.  Chest x-ray viewed by me without signs of infiltrate, pneumonia unlikely.  Pt HA treated and improved while in ED.  Presentation is like pts typical HA and non concerning for [facility], ICH, Meningitis, or temporal arteritis. Pt is afebrile with no focal neuro deficits, nuchal rigidity, or change in vision. The patient appears reasonably screened and/or stabilized for discharge and I doubt any other medical condition or other [facility] requiring  further screening, evaluation, or treatment in the ED at this time prior to discharge. The patient is safe for discharge with strict return precautions discussed. Recommend pcp follow up if symptoms persist.   Oneta RackRhonda Dack was evaluated in Emergency Department on 04/08/2019 for the symptoms  described in the history of present illness. She was evaluated in the context of the global COVID-19 pandemic, which necessitated consideration that the patient might be at risk for infection with the SARS-CoV-2 virus that causes COVID-19. Institutional protocols and algorithms that pertain to the evaluation of patients at risk for COVID-19 are in a state of rapid change based on information released by regulatory bodies including the CDC and federal and state organizations. These policies and algorithms were followed during the patient's care in the ED.    Portions of this note were generated with Scientist, clinical (histocompatibility and immunogenetics). Dictation errors may occur despite best attempts at proofreading.  Final Clinical Impressions(s) / ED Diagnoses   Final diagnoses:  Exposure to COVID-19 virus  Acute cystitis with hematuria    ED Discharge Orders         Ordered    cephALEXin (KEFLEX) 500 MG capsule  2 times daily     04/08/19 1712           Sherene Sires, PA-C 04/08/19 1727    Terrilee Files, MD 04/08/19 1818

## 2019-04-10 LAB — URINE CULTURE: Culture: >=100000 — AB

## 2019-04-11 ENCOUNTER — Telehealth: Payer: Self-pay

## 2019-04-11 NOTE — Telephone Encounter (Signed)
Post ED Visit - Positive Culture Follow-up  Culture report reviewed by antimicrobial stewardship pharmacist: [facility] Team []  Elenor Quinones, Pharm.D. [x]  Heide Guile, Pharm.D., BCPS AQ-ID []  Parks Neptune, Pharm.D., BCPS []  Alycia Rossetti, Pharm.D., BCPS []  [place], Pharm.D., BCPS, AAHIVP []  Legrand Como, Pharm.D., BCPS, AAHIVP []  Salome Arnt, PharmD, BCPS []  Johnnette Gourd, PharmD, BCPS []  Hughes Better, PharmD, BCPS []  Leeroy Cha, PharmD []  Laqueta Linden, PharmD, BCPS []  Albertina Parr, PharmD  [facility] Team []  Leodis Sias, PharmD []  Lindell Spar, PharmD []  Royetta Asal, PharmD []  Graylin Shiver, Rph []  Rema Fendt) Glennon Mac, PharmD []  Arlyn Dunning, PharmD []  Netta Cedars, PharmD []  Dia Sitter, PharmD []  Leone Haven, PharmD []  Gretta Arab, PharmD []  Theodis Shove, PharmD []  Peggyann Juba, PharmD []  Reuel Boom, PharmD   Positive urine culture Treated with Cephalexin, organism sensitive to the same and no further patient follow-up is required at this time.  Genia Del 04/11/2019, 10:29 AM

## 2019-05-28 DIAGNOSIS — B373 Candidiasis of vulva and vagina: Secondary | ICD-10-CM | POA: Diagnosis not present

## 2019-06-26 DIAGNOSIS — F410 Panic disorder [episodic paroxysmal anxiety] without agoraphobia: Secondary | ICD-10-CM | POA: Diagnosis not present

## 2019-06-26 DIAGNOSIS — F331 Major depressive disorder, recurrent, moderate: Secondary | ICD-10-CM | POA: Diagnosis not present

## 2019-07-19 DIAGNOSIS — Z01411 Encounter for gynecological examination (general) (routine) with abnormal findings: Secondary | ICD-10-CM | POA: Diagnosis not present

## 2019-07-19 DIAGNOSIS — E1101 Type 2 diabetes mellitus with hyperosmolarity with coma: Secondary | ICD-10-CM | POA: Diagnosis not present

## 2019-07-29 DIAGNOSIS — Z131 Encounter for screening for diabetes mellitus: Secondary | ICD-10-CM | POA: Diagnosis not present

## 2019-07-29 DIAGNOSIS — I10 Essential (primary) hypertension: Secondary | ICD-10-CM | POA: Diagnosis not present

## 2019-07-29 DIAGNOSIS — Z1389 Encounter for screening for other disorder: Secondary | ICD-10-CM | POA: Diagnosis not present

## 2019-07-29 DIAGNOSIS — Z1329 Encounter for screening for other suspected endocrine disorder: Secondary | ICD-10-CM | POA: Diagnosis not present

## 2019-07-29 DIAGNOSIS — E1165 Type 2 diabetes mellitus with hyperglycemia: Secondary | ICD-10-CM | POA: Diagnosis not present

## 2019-07-29 DIAGNOSIS — Z0001 Encounter for general adult medical examination with abnormal findings: Secondary | ICD-10-CM | POA: Diagnosis not present

## 2019-07-29 DIAGNOSIS — Z136 Encounter for screening for cardiovascular disorders: Secondary | ICD-10-CM | POA: Diagnosis not present

## 2019-07-29 DIAGNOSIS — Z01021 Encounter for examination of eyes and vision following failed vision screening with abnormal findings: Secondary | ICD-10-CM | POA: Diagnosis not present

## 2019-09-03 DIAGNOSIS — H40023 Open angle with borderline findings, high risk, bilateral: Secondary | ICD-10-CM | POA: Diagnosis not present

## 2019-11-27 DIAGNOSIS — F4321 Adjustment disorder with depressed mood: Secondary | ICD-10-CM | POA: Diagnosis not present

## 2019-11-27 DIAGNOSIS — F331 Major depressive disorder, recurrent, moderate: Secondary | ICD-10-CM | POA: Diagnosis not present

## 2020-12-25 DIAGNOSIS — F3342 Major depressive disorder, recurrent, in full remission: Secondary | ICD-10-CM | POA: Diagnosis not present

## 2020-12-25 DIAGNOSIS — F410 Panic disorder [episodic paroxysmal anxiety] without agoraphobia: Secondary | ICD-10-CM | POA: Diagnosis not present

## 2021-03-12 DIAGNOSIS — G43009 Migraine without aura, not intractable, without status migrainosus: Secondary | ICD-10-CM | POA: Diagnosis not present

## 2021-03-12 DIAGNOSIS — L309 Dermatitis, unspecified: Secondary | ICD-10-CM | POA: Diagnosis not present

## 2021-07-03 DIAGNOSIS — F3342 Major depressive disorder, recurrent, in full remission: Secondary | ICD-10-CM | POA: Diagnosis not present

## 2021-07-03 DIAGNOSIS — F410 Panic disorder [episodic paroxysmal anxiety] without agoraphobia: Secondary | ICD-10-CM | POA: Diagnosis not present

## 2021-07-16 IMAGING — DX DG CHEST 1V PORT
1 series · 1 of 1 positions shown · non-contrast
Comparison: None.

CLINICAL DATA: Cough with fever and chills.  Shortness of breath

EXAM:
PORTABLE CHEST 1 VIEW

[chest ap]
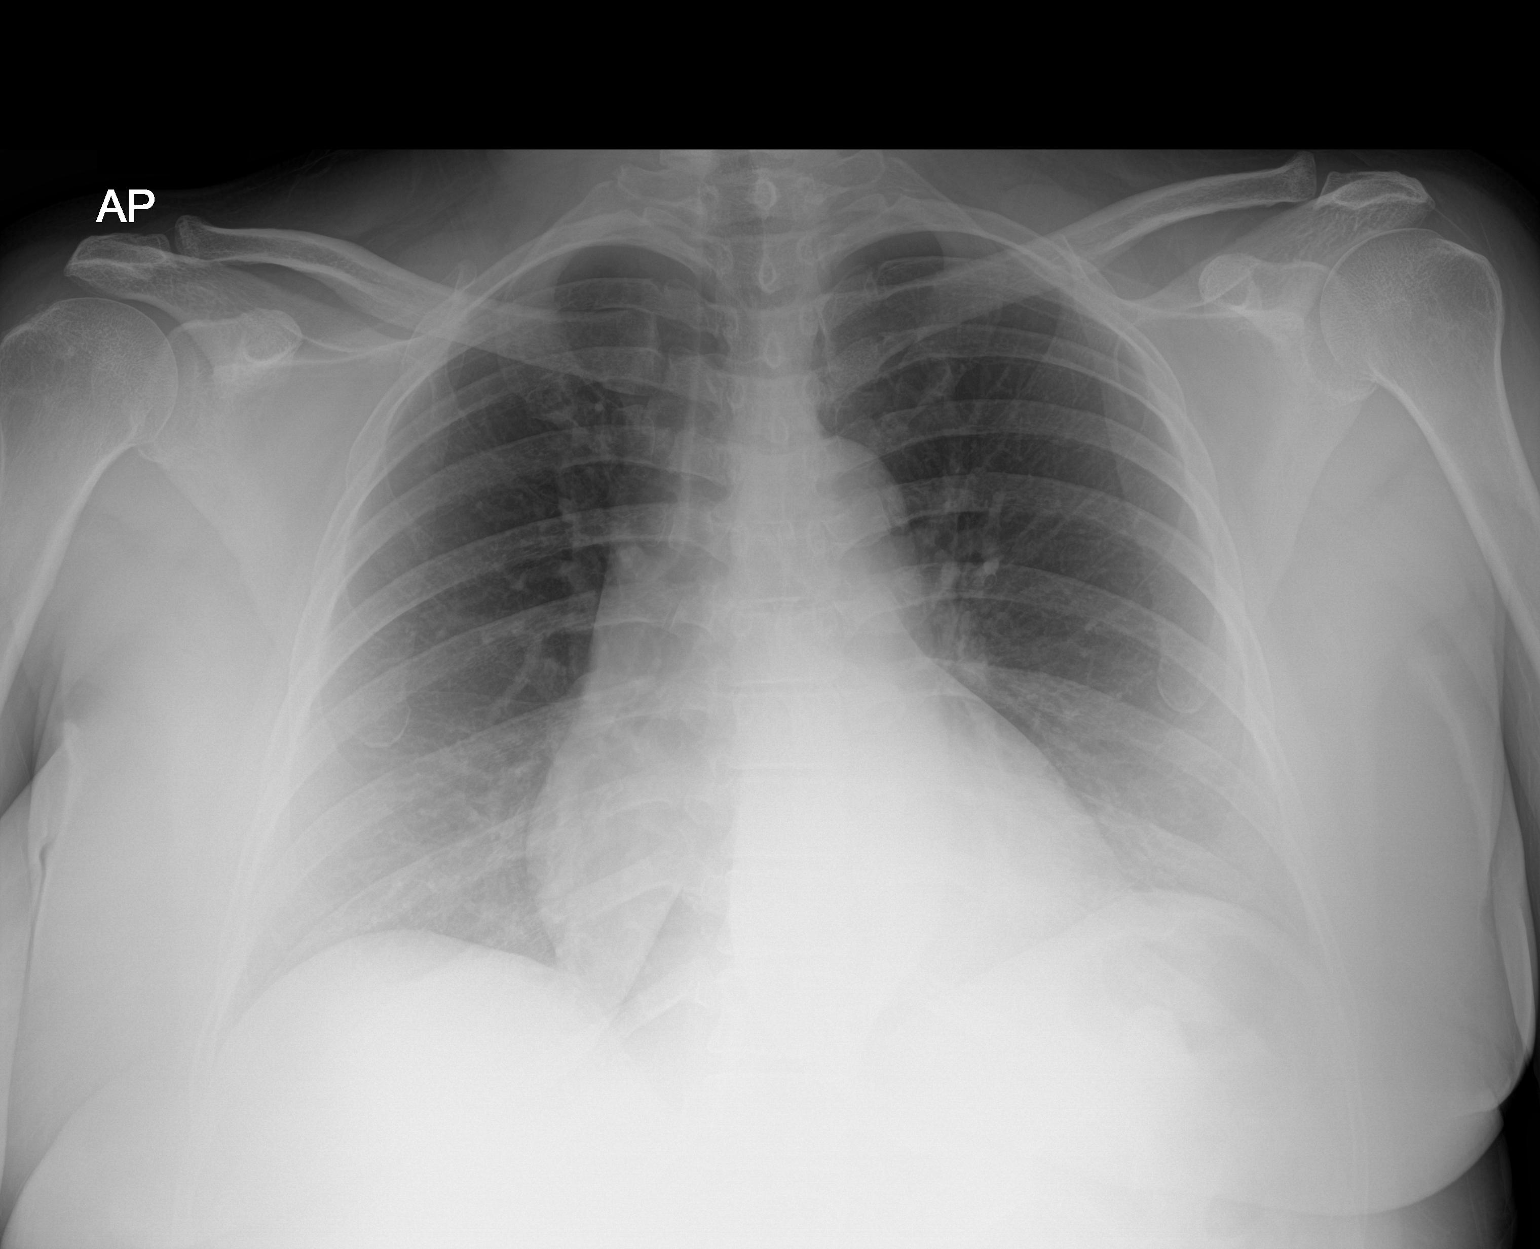

[1 of 1 positions shown; findings below may reference images not displayed]

FINDINGS: There is no edema or consolidation. Heart size and pulmonary
vascularity are normal. No adenopathy. No bone lesions.
IMPRESSION: No edema or consolidation.

## 2021-07-23 DIAGNOSIS — E538 Deficiency of other specified B group vitamins: Secondary | ICD-10-CM | POA: Diagnosis not present

## 2021-07-23 DIAGNOSIS — E1165 Type 2 diabetes mellitus with hyperglycemia: Secondary | ICD-10-CM | POA: Diagnosis not present

## 2021-07-23 DIAGNOSIS — R0602 Shortness of breath: Secondary | ICD-10-CM | POA: Diagnosis not present

## 2021-07-23 DIAGNOSIS — R5383 Other fatigue: Secondary | ICD-10-CM | POA: Diagnosis not present

## 2021-07-23 DIAGNOSIS — Z1339 Encounter for screening examination for other mental health and behavioral disorders: Secondary | ICD-10-CM | POA: Diagnosis not present

## 2021-07-23 DIAGNOSIS — Z0000 Encounter for general adult medical examination without abnormal findings: Secondary | ICD-10-CM | POA: Diagnosis not present

## 2021-07-23 DIAGNOSIS — Z114 Encounter for screening for human immunodeficiency virus [HIV]: Secondary | ICD-10-CM | POA: Diagnosis not present

## 2021-07-23 DIAGNOSIS — R350 Frequency of micturition: Secondary | ICD-10-CM | POA: Diagnosis not present

## 2021-07-23 DIAGNOSIS — E559 Vitamin D deficiency, unspecified: Secondary | ICD-10-CM | POA: Diagnosis not present

## 2021-08-06 DIAGNOSIS — E559 Vitamin D deficiency, unspecified: Secondary | ICD-10-CM | POA: Diagnosis not present

## 2021-08-06 DIAGNOSIS — E7800 Pure hypercholesterolemia, unspecified: Secondary | ICD-10-CM | POA: Diagnosis not present

## 2021-08-06 DIAGNOSIS — E1165 Type 2 diabetes mellitus with hyperglycemia: Secondary | ICD-10-CM | POA: Diagnosis not present

## 2021-09-01 DIAGNOSIS — I87393 Chronic venous hypertension (idiopathic) with other complications of bilateral lower extremity: Secondary | ICD-10-CM | POA: Diagnosis not present

## 2021-09-23 DIAGNOSIS — E7800 Pure hypercholesterolemia, unspecified: Secondary | ICD-10-CM | POA: Diagnosis not present

## 2021-09-23 DIAGNOSIS — E559 Vitamin D deficiency, unspecified: Secondary | ICD-10-CM | POA: Diagnosis not present

## 2021-09-23 DIAGNOSIS — E1165 Type 2 diabetes mellitus with hyperglycemia: Secondary | ICD-10-CM | POA: Diagnosis not present

## 2021-12-24 DIAGNOSIS — F3342 Major depressive disorder, recurrent, in full remission: Secondary | ICD-10-CM | POA: Diagnosis not present

## 2021-12-24 DIAGNOSIS — F410 Panic disorder [episodic paroxysmal anxiety] without agoraphobia: Secondary | ICD-10-CM | POA: Diagnosis not present

## 2022-01-26 DIAGNOSIS — E7800 Pure hypercholesterolemia, unspecified: Secondary | ICD-10-CM | POA: Diagnosis not present

## 2022-01-26 DIAGNOSIS — Z79899 Other long term (current) drug therapy: Secondary | ICD-10-CM | POA: Diagnosis not present

## 2022-01-26 DIAGNOSIS — E119 Type 2 diabetes mellitus without complications: Secondary | ICD-10-CM | POA: Diagnosis not present

## 2022-01-26 DIAGNOSIS — E1165 Type 2 diabetes mellitus with hyperglycemia: Secondary | ICD-10-CM | POA: Diagnosis not present

## 2022-01-26 DIAGNOSIS — E559 Vitamin D deficiency, unspecified: Secondary | ICD-10-CM | POA: Diagnosis not present
